# Patient Record
Sex: Male | Born: 1990 | Race: Black or African American | Hispanic: No | Marital: Single | Smoking: Current every day smoker
Health system: Southern US, Community
[De-identification: ages and names within clinical notes are randomized; demographics above are authoritative.]

---

## 2014-06-28 ENCOUNTER — Emergency Department (HOSPITAL_COMMUNITY): Payer: Self-pay

## 2014-06-28 ENCOUNTER — Emergency Department (HOSPITAL_COMMUNITY)
Admission: EM | Admit: 2014-06-28 | Discharge: 2014-06-28 | Disposition: A | Discharge status: Home / Self Care | Payer: Self-pay | Attending: Emergency Medicine | Admitting: Emergency Medicine

## 2014-06-28 ENCOUNTER — Encounter (HOSPITAL_COMMUNITY): Payer: Self-pay | Admitting: Emergency Medicine

## 2014-06-28 DIAGNOSIS — S8990XA Unspecified injury of unspecified lower leg, initial encounter: Secondary | ICD-10-CM | POA: Insufficient documentation

## 2014-06-28 DIAGNOSIS — S99929A Unspecified injury of unspecified foot, initial encounter: Principal | ICD-10-CM

## 2014-06-28 DIAGNOSIS — M25561 Pain in right knee: Secondary | ICD-10-CM

## 2014-06-28 DIAGNOSIS — K610 Anal abscess: Secondary | ICD-10-CM

## 2014-06-28 DIAGNOSIS — S99919A Unspecified injury of unspecified ankle, initial encounter: Principal | ICD-10-CM

## 2014-06-28 DIAGNOSIS — IMO0002 Reserved for concepts with insufficient information to code with codable children: Secondary | ICD-10-CM | POA: Insufficient documentation

## 2014-06-28 DIAGNOSIS — F17200 Nicotine dependence, unspecified, uncomplicated: Secondary | ICD-10-CM | POA: Insufficient documentation

## 2014-06-28 DIAGNOSIS — M25571 Pain in right ankle and joints of right foot: Secondary | ICD-10-CM

## 2014-06-28 DIAGNOSIS — W19XXXA Unspecified fall, initial encounter: Secondary | ICD-10-CM

## 2014-06-28 DIAGNOSIS — Y9389 Activity, other specified: Secondary | ICD-10-CM | POA: Insufficient documentation

## 2014-06-28 DIAGNOSIS — W108XXA Fall (on) (from) other stairs and steps, initial encounter: Secondary | ICD-10-CM | POA: Insufficient documentation

## 2014-06-28 DIAGNOSIS — K612 Anorectal abscess: Secondary | ICD-10-CM | POA: Insufficient documentation

## 2014-06-28 DIAGNOSIS — Y9289 Other specified places as the place of occurrence of the external cause: Secondary | ICD-10-CM | POA: Insufficient documentation

## 2014-06-28 MED ORDER — HYDROCODONE-ACETAMINOPHEN 5-325 MG PO TABS
1.0000 | ORAL_TABLET | Freq: Once | ORAL | Status: AC
  Administered 2014-06-28: 1 via ORAL
  Filled 2014-06-28: qty 1

## 2014-06-28 MED ORDER — LIDOCAINE HCL 1 % IJ SOLN
INTRAMUSCULAR | Status: AC
Start: 2014-06-28 — End: 2014-06-28
  Administered 2014-06-28: 09:00:00
  Filled 2014-06-28: qty 20

## 2014-06-28 MED ORDER — SULFAMETHOXAZOLE-TRIMETHOPRIM 800-160 MG PO TABS: 1.0000 | ORAL_TABLET | Freq: Two times a day (BID) | ORAL | Status: AC

## 2014-06-28 MED ORDER — SULFAMETHOXAZOLE-TMP DS 800-160 MG PO TABS
1.0000 | ORAL_TABLET | Freq: Once | ORAL | Status: AC
  Administered 2014-06-28: 1 via ORAL
  Filled 2014-06-28: qty 1

## 2014-06-28 NOTE — ED Notes (Signed)
MD at bedside. 

## 2014-06-28 NOTE — ED Provider Notes (Signed)
Patient sign out from Elsie Stain, PA-C. At 0600. Patient with cocaine intoxication and fell down some steps. Patient presents with back pain, right ankle pain, right knee pain. Also with boil on buttocks.   Physical Exam  BP 117/65  Pulse 89  Temp(Src) 98.4 F (36.9 C) (Oral)  Resp 20  SpO2 95%  Physical Exam  Nursing note and vitals reviewed. Constitutional: He appears well-developed and well-nourished. No distress.  HENT:  Head: Normocephalic and atraumatic.  Eyes: Conjunctivae are normal. Right eye exhibits no discharge. Left eye exhibits no discharge.  Pulmonary/Chest: Effort normal. No respiratory distress.  Genitourinary:  Perianal abscess on right buttock 3cm from anus with gauze in place. No crepitus. Surrounding signs of mild cellulitis with induration. Bleeding control. No lesions or skin changes to scrotum, penis or peritoneum.   Musculoskeletal:  Right ankle tenderness with ROM. FROM. No warmth, erythema or edema. Neurovascularly intact. Right knee without abrasion, effusion, erythema, warmth. Full ROM. Antalgic gait.   Neurological: He is alert. Coordination normal.  Skin: He is not diaphoretic.  Psychiatric: He has a normal mood and affect. His behavior is normal.    ED Course  Procedures I and D performed by Elsie Stain.   Right Knee Xray FINDINGS: There is no evidence of fracture, dislocation. There is a small suprapatellar effusion. There is no evidence of arthropathy or other focal bone abnormality. Soft tissues are unremarkable.  IMPRESSION: No acute fracture or dislocation. Small suprapatellar effusion.  Right ankle FINDINGS: There is no evidence of fracture, dislocation, or joint effusion. There is no evidence of arthropathy or other focal bone abnormality. Soft tissues are unremarkable.  IMPRESSION: Normal exam.  Lumbar FINDINGS: No suspected fracture. Concavities of the L3 and L4 superior endplates are smooth and likely developmental. No  subluxation. No significant degenerative change.  IMPRESSION: Negative.    Meds given in ED:  Medications  lidocaine (XYLOCAINE) 1 % (with pres) injection (  Given by Other 06/28/14 0834)  sulfamethoxazole-trimethoprim (BACTRIM DS) 800-160 MG per tablet 1 tablet (1 tablet Oral Given 06/28/14 0619)  HYDROcodone-acetaminophen (NORCO/VICODIN) 5-325 MG per tablet 1 tablet (1 tablet Oral Given 06/28/14 0619)    New Prescriptions   No medications on file     MDM Fall Perianal abscess Right ankle pain Right knee pain Back pain  Patient with MSK pain after fall without acute fractures or dislocation. Patient with mild suprapatellar effusion. No fevers, nausea, vomiting, warmth, erythema or edema on exam. I doubt septic arthritis.  Patient with antalgic gait but can walk. Patient to follow up for perianal abscess with surrounding mild cellulitis in 2 days at urgent care or in ED. No crepitus or changes to scrotum, penis or peritoneum. No signs of Fournier gangrene and I doubt it. Patient given dose of bactrim in ED and script for home. Patient without PCP and resources provided.  Discussed return precautions with patient. Discussed all results and patient verbalizes understanding and agrees with plan.        Louann Sjogren, PA-C 06/28/14 [phone]

## 2014-06-28 NOTE — Progress Notes (Signed)
[facility] Community Liaison Stacy, ° °Did not get to see patient but will be sending information about [facility] Orange Card program to help patient establish primary care, using the address provided.  °

## 2014-06-28 NOTE — Discharge Instructions (Signed)
Return to the emergency room with worsening of symptoms, new symptoms or with symptoms that are concerning, especially severe pain, swelling, redness or red streaks from area or fevers. RICE: Rest, Ice (three cycles of 20 mins on, off at least twice a day), compression/brace, elevation. Heating pad works well for back pain. Ibuprofen  (2 tablets ) every 5-6 hours for 3-5 days and then as needed for pain. Please call your doctor for a followup appointment within 24-48 hours. When you talk to your doctor please let them know that you were seen in the emergency department and have them acquire all of your records so that they can discuss the findings with you and formulate a treatment plan to fully care for your new and ongoing problems. If you do not have a primary care provider please call the number below under ED resources to establish care with a provider and follow up.  Follow up in 2 days for wound recheck. If you do not have a PCP make appointment with urgent care or come to ED.   Emergency Department Resource Guide 1) Find a Doctor and Pay Out of Pocket Although you won't have to find out who is covered by your insurance plan, it is a good idea to ask around and get recommendations. You will then need to call the office and see if the doctor you have chosen will accept you as a new patient and what types of options they offer for patients who are self-pay. Some doctors offer discounts or will set up payment plans for their patients who do not have insurance, but you will need to ask so you aren't surprised when you get to your appointment.  2) Contact Your Local Health Department Not all health departments have doctors that can see patients for sick visits, but many do, so it is worth a call to see if yours does. If you don't know where your local health department is, you can check in your phone book. The CDC also has a tool to help you locate your state's health department, and  many state websites also have listings of all of their local health departments.  3) Find a Walk-in Clinic If your illness is not likely to be very severe or complicated, you may want to try a walk in clinic. These are popping up all over the country in pharmacies, drugstores, and shopping centers. They're usually staffed by nurse practitioners or physician assistants that have been trained to treat common illnesses and complaints. They're usually fairly quick and inexpensive. However, if you have serious medical issues or chronic medical problems, these are probably not your best option.  No Primary Care Doctor: - Call Health Connect at  [phone] - they can help you locate a primary care doctor that  accepts your insurance, provides certain services, etc. - Physician Referral Service- [phone]  Chronic Pain Problems: Organization         Address  Phone   Notes  [facility]  [phone] Patients need to be referred by their primary care doctor.   Medication Assistance: Organization         Address  Phone   Notes  [facility] Medication [facility] [address] [place] [place], [place] [address] [phone] --Must be a resident of [facility] -- Must have NO insurance coverage whatsoever (no Medicaid/ Medicare, etc.) -- The pt. MUST have a primary care doctor that directs their care regularly and follows them  in the community   [facility]ist  [phone]   [employer]  [phone]    Agencies that provide inexpensive medical care: Organization         Address  Phone   Notes  [facility]  [phone]   [facility]    [phone]   [facility] [address] [place], [place] [address] [phone]   [facility] [address] [place] [place] [phone]   Planned Parenthood    [phone]   [facility]    [phone]   Community Health  and [facility]  [address], [place] Phone:  [phone], Fax:  [phone] Hours of Operation:  9 am - 6 pm, M-F.  Also accepts Medicaid/Medicare and self-pay.  [facility]  [address], [place] Phone: [phone], Fax: [phone]. Hours of Operation:  8:30 am - 5:30 pm, M-F.  Also accepts Medicaid and self-pay.  [facility] [place] [address], [place] Phone: [phone]   [facility] [address] Natasha Bence [place], [place] [phone], Ext. [phone] Mondays & Thursdays: 7-9 AM.  First 15 patients are seen on a first come, first serve basis.    Medicaid-accepting [facility] Providers:  Organization         Address  Phone   Notes  [facility] [address], [place] [phone] Also accepts self-pay patients.  [facility] [address] Laurell Josephs [place], [place]  [phone]   [facility] [address], [place] [phone]   [facility] [address] [place] [phone]   Renaye Rakers [address], [place]   [phone] Only accepts [place] Access [place] patients after they have their name applied to their card.   Self-Pay (no insurance) in [facility]:  Organization         Address  Phone   Notes  Sickle Cell Patients, [facility] [address] [place], [place] [phone]   [facility] [address] [place], [place] [phone]   [facility]  [address], [place] [phone]   [facility]/Dr. Osei-Bonsu  [address], [place] or [address], [place] [phone] Phone number for both [place] and [place] locations is the same.  [facility] [address] [place] [phone]   [facility]  [address], [place] or [address] [phone]   [facility] [address] [place] [phone], phone; [phone], fax Sees patients 1st and 3rd Saturday of every month.  Must not qualify for public or private insurance (i.e. Medicaid, Medicare, [place] Health Choice, Veterans' Benefits)  Household income should be no more than 200% of the poverty level The clinic cannot treat you if you are pregnant or think you are pregnant  Sexually transmitted diseases are not treated at the clinic.    Dental Care: Organization         Address  Phone  Notes  [facility] Department of [facility] [address] [place], [place] [phone] Accepts children up to age 45 who are enrolled in  Medicaid or [place] Health Choice; pregnant women with a Medicaid card; and children who have applied for Medicaid or [place] Health Choice, but were declined, whose parents can pay a reduced fee at time of service.  [facility]  [address], [place] [phone] Accepts children up to age 24 who are enrolled in [place] or [place] Health Choice; pregnant women with a Medicaid card; and children who have applied for Medicaid or [place] Health Choice, but were declined, whose parents can pay a reduced fee at time of service.  [facility]  [address] [place], [place] [phone] Patients are seen by appointment only. Walk-ins are not accepted. [facility] will see patients 94 years of age and older. Monday - Tuesday (8am-5pm) Most Wednesdays (8:30-5pm) $30 per visit, cash only  [facility] Adult Dental Access PROGRAM  [address], [facility] [phone] Patients are seen by appointment only. Walk-ins are not accepted. [facility] will see patients 25 years of age and older. One Wednesday Evening (Monthly: Volunteer Based).  $30 per visit, cash only   [employer]  [phone] for adults; Children under age 69, call Graduate Pediatric Dentistry at [phone]. Children aged 34-14, please call [phone] to request a pediatric application.  Dental services are provided in all areas of dental care including fillings, crowns and bridges, complete and partial dentures, implants, gum treatment, root canals, and extractions. Preventive care is also provided. Treatment is provided to both adults and children. Patients are selected via a lottery and there is often a waiting list.   [facility] [address], [place]  [phone] [email or web address]   [facility] [address] [place], [place] [phone], Ext. [phone] Second and Fourth Thursday of each month, opens at 6:30 AM; Clinic ends at 9 AM.  Patients are seen on a first-come first-served basis, and a limited number are seen during each clinic.   [facility]  [address] Ether Griffins [place], [place] [phone]   Eligibility Requirements You must have lived in [place], [place], or [place] counties for at least the last three months.   You cannot be eligible for state or federal sponsored [place], including [employer], [place], or [employer].   You generally cannot be eligible for healthcare insurance through your employer.    How to apply: Eligibility screenings are held every Tuesday and Wednesday afternoon from 1:00 pm until 4:00 pm. You do not need an appointment for the interview!  [facility] [address], [place], [place] [phone]   [facility] Health Department  [phone]   [facility] Health Department  [phone]   [facility] Health Department  [phone]    Behavioral Health Resources in the Community: Intensive Outpatient Programs Organization         Address  Phone  Notes  [facility] [address] [place],  [place] [phone]   [facility] [address] [place], [place] [phone]   [facility] [address], [place], [place]  [phone]   [facility] [address]  [place], [place] [phone] or [phone]   Substance Abuse Resources Organization         Address  Phone  Notes  [facility]  [phone]   [facility]  [phone]   [employer]  [phone]   Floydene Flock  [phone]   Residential & Outpatient Substance Abuse Program  [phone]   Psychological Services Organization         Address  Phone  Notes  [facility] Behavioral Health  [phone]   [facility] Services  [phone]   [facility] [address] [place] [phone] or [phone]    Mobile Crisis Teams Organization         Address  Phone  Notes  [facility], Mobile Crisis Care Unit  [phone]   [facility]  [address] [place], [place] [phone]   Doristine Locks [address] [place] [phone]    Self-Help/Support Groups Organization         Address  Phone             Notes  [facility] [place] - variety of support groups  [phone]- I7437963 Call for more information  Narcotics Anonymous (NA), [facility] [address], [employer] [place]  2 meetings at this location   Statistician         Address  Phone  Notes  [facility] [address],    [place]  [phone]   [facility]  [address], [place] [address], [place], [place] [phone]   [facility] [address] [place], [place] [phone] Admissions: 8am-3pm M-F  [facility] [address],    [place], [place] [phone]   [facility] [address] Rodri­guez Hevia, [place], [place] [phone]   [facility] [address]  [place], [place] [phone]   [facility] - Intensive Outpatient [address] Laurell Josephs [address], [place], [place] [phone]   [facility] ([facility] [address] [place]  [place], [place] [phone] or [phone]   [facility] ([facility]) [address], [place], [place] [phone] Accepts Medicaid  [facility] [place] [address],  [place] [phone] Substance Abuse/Addiction Treatment   [facility] Organization         Address  Phone  Notes  [facility]  [phone]   Angie Fava, PhD [address] Ervin Knack [place], [place]   [phone] or [phone]   [facility]   [address] [place], [place] [phone]   [facility] [address], [place], [place] [phone] Insurance/Medicaid/sponsorship through [facility] and Families [address]                                    [place], [place] [phone] Therapy/tele-psych/case  [facility] [address], [place] [phone]    Dr. Lolly Mustache  [phone]   [facility]  [employer] [facility] Dept. 1) [address] [place] [address] [place] 2) [address], [place] 3)  [address], [place] [phone]  [phone]   [facility] Child Abuse Hotline (440)125-2284 or [phone] (After Hours)      Abscess Care After An abscess (also called a boil or furuncle) is an infected area that contains a collection of pus. Signs and symptoms of an abscess include pain, tenderness, redness, or hardness, or you may feel a moveable soft area under your skin. An abscess can  occur anywhere in the body. The infection may spread to surrounding tissues causing cellulitis. A cut (incision) by the surgeon was made over your abscess and the pus was drained out. Gauze may have been packed into the space to provide a drain that will allow  the cavity to heal from the inside outwards. The boil may be painful for 5 to 7 days. Most people with a boil do not have high fevers. Your abscess, if seen early, may not have localized, and may not have been lanced. If not, another appointment may be required for this if it does not get better on its own or with medications. HOME CARE INSTRUCTIONS   Only take over-the-counter or prescription medicines for pain, discomfort, or fever as directed by your caregiver.  When you bathe, soak and then remove gauze or iodoform packs at least daily or as directed by your caregiver. You may then wash the wound gently with mild soapy water. Repack with gauze or do as your caregiver directs. SEEK IMMEDIATE MEDICAL CARE IF:   You develop increased pain, swelling, redness, drainage, or bleeding in the wound site.  You develop signs of generalized infection including muscle aches, chills, fever, or a general ill feeling.  An oral temperature above 102 F (38.9 C) develops, not controlled by medication. See your caregiver for a recheck if you develop any of the symptoms described above. If medications (antibiotics) were prescribed, take them as directed. Document Released: 04/11/2005 Document Revised: 12/16/2011 Document Reviewed: 12/07/2007 [facility] Patient Information 2015 [place], [place]. This information is not intended to replace advice given to you by your health care provider. Make sure you discuss any questions you have with your health care provider.

## 2014-06-28 NOTE — ED Notes (Signed)
Pt sluggish getting up and continues to go back to sleep.  Pt awakened again and getting dressed

## 2014-06-28 NOTE — ED Provider Notes (Signed)
Medical screening examination/treatment/procedure(s) were performed by non-physician practitioner and as supervising physician I was immediately available for consultation/collaboration.   EKG Interpretation None        Johnanthony Wilden, MD 06/28/14 2315 

## 2014-06-28 NOTE — ED Provider Notes (Signed)
CSN: [id number]     Arrival date & time 06/28/14  0244 History   First MD Initiated Contact with Patient 06/28/14 0440     Chief Complaint  Patient presents with  . Fall     (Consider location/radiation/quality/duration/timing/severity/associated sxs/prior Treatment) HPI Comments: Patient with perineal abscess for 3 day he "squeezed" the area and drained a bit or purulent material with blood 1 day ago since has had increased pain radiating to leg. Tonight after drinking a large amount of alcohol and using Cocain fell down 5 steps further aggravating the abscess and injuring his lower back.  Denies hitting his head no neck or upper back pain   Patient is a 23 y.o. male presenting with fall and abscess. The history is provided by the patient.  Fall This is a new problem. The current episode started today. The problem occurs constantly. The problem has been unchanged. Associated symptoms include chills. Pertinent negatives include no abdominal pain, chest pain, fever, headaches, joint swelling, nausea or neck pain. Nothing aggravates the symptoms. He has tried nothing for the symptoms. The treatment provided no relief.  Abscess Location:  Ano-genital Ano-genital abscess location:  Perineum Abscess quality: induration, painful, redness and warmth   Abscess quality: not draining and no fluctuance   Red streaking: no   Duration:  3 days Progression:  Unchanged Pain details:    Quality:  Pressure and throbbing   Severity:  Moderate   Duration:  3 days   Timing:  Constant   Progression:  Worsening Chronicity:  New Context: not diabetes, not immunosuppression and not skin injury   Relieved by:  Nothing Worsened by:  Draining/squeezing Ineffective treatments:  None tried Associated symptoms: no fever, no headaches and no nausea     History reviewed. No pertinent past medical history. History reviewed. No pertinent past surgical history. History reviewed. No pertinent family  history. History  Substance Use Topics  . Smoking status: Current Every Day Smoker  . Smokeless tobacco: Not on file  . Alcohol Use: Yes    Review of Systems  Constitutional: Positive for chills. Negative for fever.  Respiratory: Negative for shortness of breath.   Cardiovascular: Negative for chest pain and leg swelling.  Gastrointestinal: Negative for nausea and abdominal pain.  Genitourinary: Negative for scrotal swelling and testicular pain.  Musculoskeletal: Negative for joint swelling, neck pain and neck stiffness.  Skin: Positive for wound.  Neurological: Negative for dizziness and headaches.  All other systems reviewed and are negative.     Allergies  Review of patient's allergies indicates no known allergies.  Home Medications   Prior to Admission medications   Not on File   BP 137/75  Pulse 93  Temp(Src) 98.1 F (36.7 C) (Oral)  Resp 20  SpO2 97% Physical Exam  Nursing note and vitals reviewed. Constitutional: He appears well-developed and well-nourished.  HENT:  Head: Normocephalic.  Mouth/Throat: Oropharynx is clear and moist.  Eyes: Pupils are equal, round, and reactive to light.  Neck: Normal range of motion.  Cardiovascular: Normal rate and regular rhythm.   Pulmonary/Chest: Effort normal and breath sounds normal.  Abdominal: Soft. He exhibits no distension. There is no tenderness.  Genitourinary: Testes normal and penis normal. Right testis shows no swelling and no tenderness. Left testis shows no swelling and no tenderness. Circumcised.     Musculoskeletal: He exhibits tenderness.       Lumbar back: He exhibits tenderness and pain. He exhibits normal range of motion, no swelling, no laceration and no  spasm.       Back:  Neurological: He is alert.  Skin: Skin is warm.    ED Course  Procedures (including critical care time) Labs Review Labs Reviewed - No data to display  Imaging Review No results found.   EKG Interpretation None       MDM   Final diagnoses:  None        Arman Filter, NP 06/28/14 [phone]

## 2014-06-28 NOTE — ED Provider Notes (Signed)
Medical screening examination/treatment/procedure(s) were performed by non-physician practitioner and as supervising physician I was immediately available for consultation/collaboration.   EKG Interpretation None        Maudry Zeidan, MD 06/28/14 2314 

## 2014-06-28 NOTE — ED Notes (Signed)
Pt came up to registration desk stating that he had been waiting for crutches.  Stated "someone told me I was getting some, but no one has come out."  This Consulting civil engineer followed up w/ Johnny Bridge RN who stated that the PA had initially mentioned crutches, but changed her mind after seeing him ambulate to the restroom w/o difficulty.  She confirmed that the Pt had been informed of this change prior to discharge.  Pt informed, again, by this Consulting civil engineer.  Pt, then, asked for a bus pass, which was provided.

## 2014-06-28 NOTE — ED Notes (Signed)
Patient transported to X-ray 

## 2014-06-28 NOTE — ED Notes (Signed)
Pt states that he fell down some steps tonight, he has been drinking and doing cocaine, he also states that he has a boil on on his buttocks and he complains of leg and back pain. Pt is super high in triage.

## 2016-05-20 IMAGING — CR DG KNEE COMPLETE 4+V*R*
5 series · 5 of 5 positions shown · non-contrast
Comparison: None.

CLINICAL DATA: fall, pain in the anterior right knee at patella
since this morning

EXAM:
RIGHT KNEE - COMPLETE 4+ VIEW

[x knee ap right (1 of 3)]
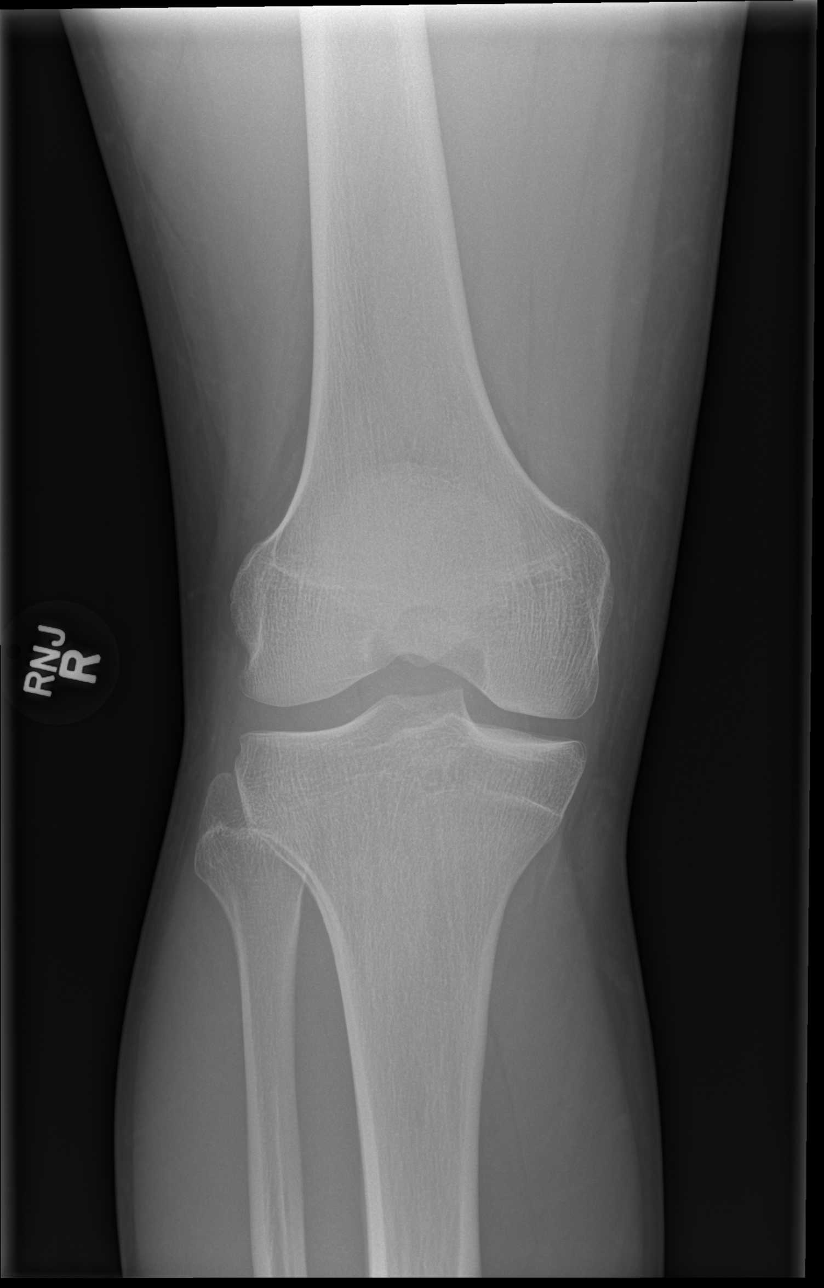

[x knee ap right (2 of 3)]
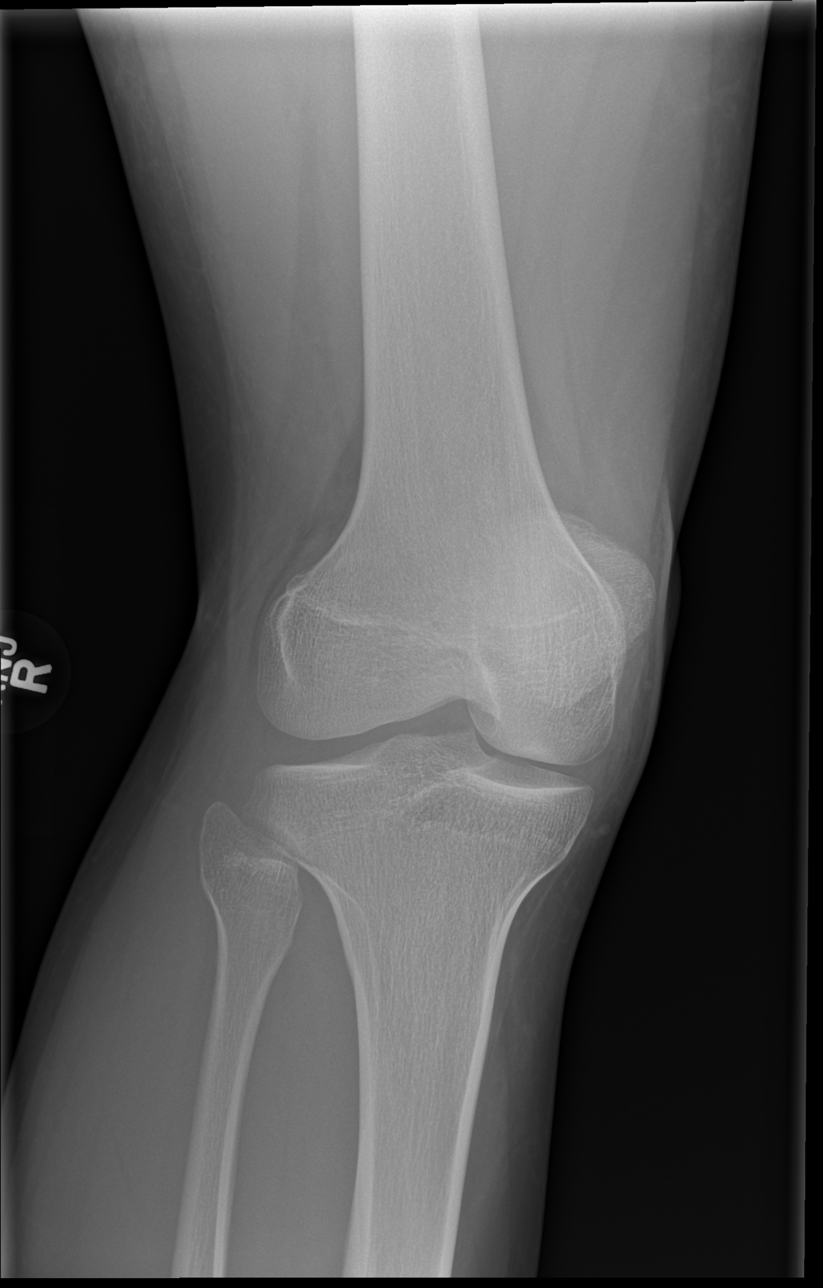

[x knee ap right (3 of 3)]
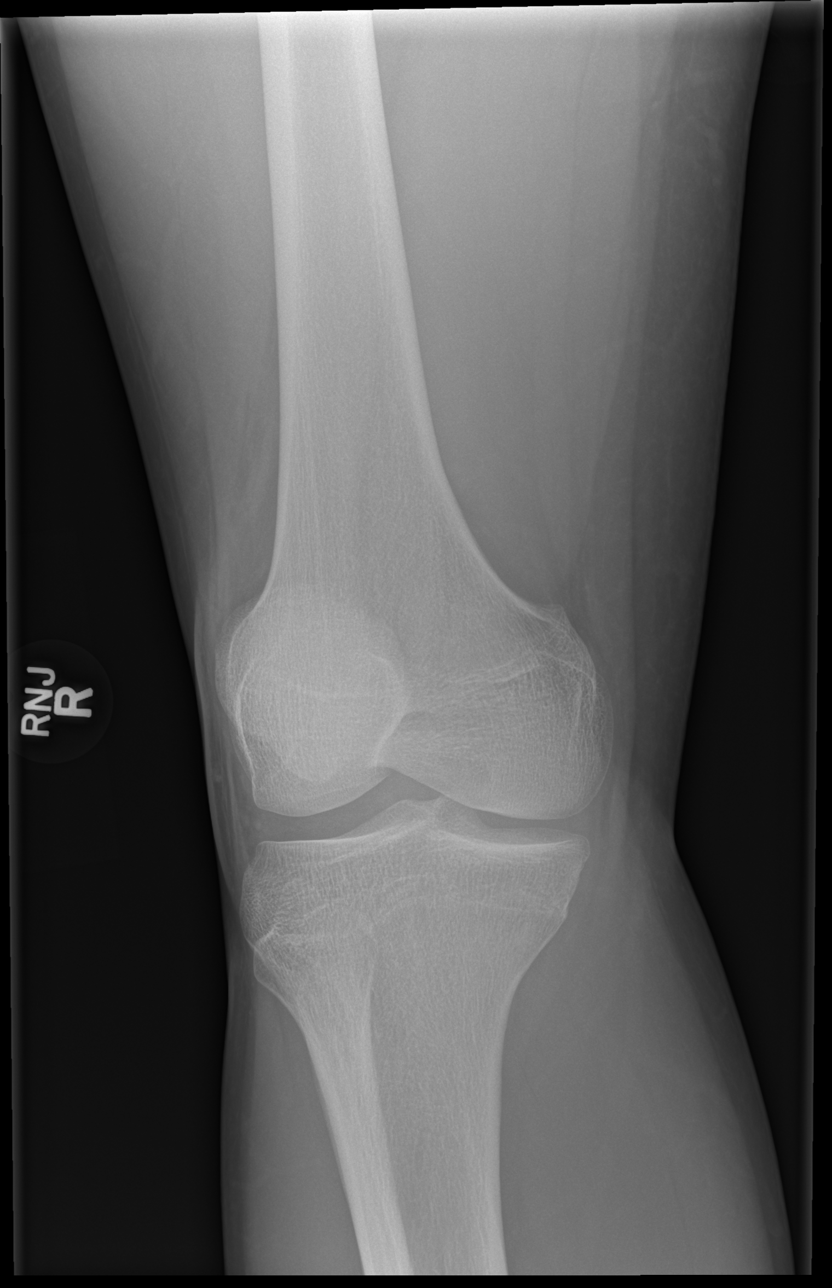

[x knee lat right (1 of 2)]
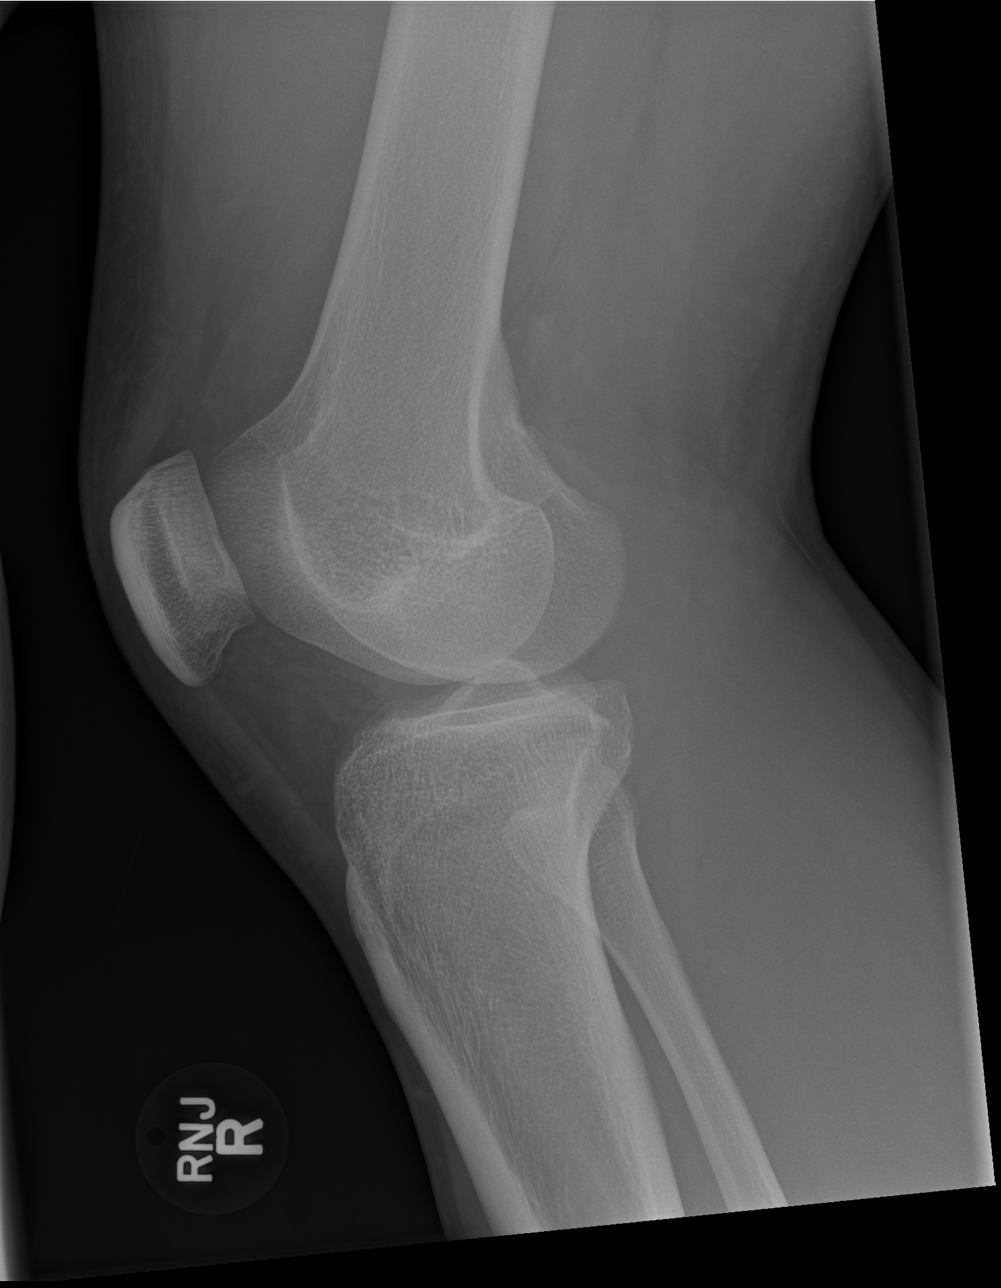

[x knee lat right (2 of 2)]
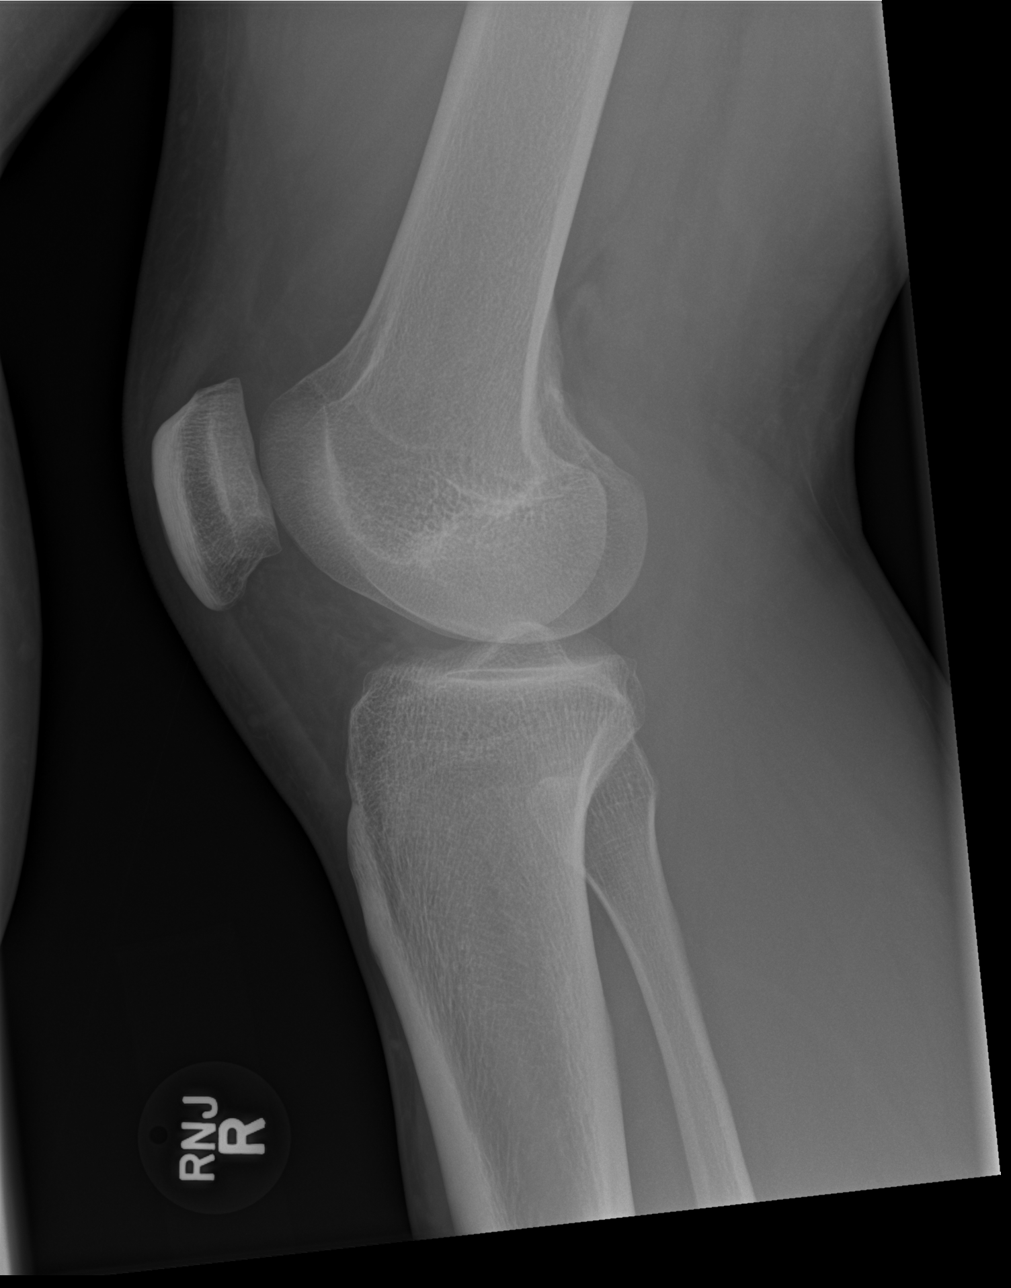

[5 of 5 positions shown; findings below may reference images not displayed]

FINDINGS: There is no evidence of fracture, dislocation. There is a small
suprapatellar effusion. There is no evidence of arthropathy or other
focal bone abnormality. Soft tissues are unremarkable.
IMPRESSION: No acute fracture or dislocation.  Small suprapatellar effusion.

## 2019-08-08 DEATH — deceased
